# Patient Record
Sex: Male | Born: 2000 | Race: Black or African American | Hispanic: No | Marital: Single | Smoking: Never smoker
Health system: Southern US, Community
[De-identification: ages and names within clinical notes are randomized; demographics above are authoritative.]

## PROBLEM LIST (undated history)

## (undated) DIAGNOSIS — J45909 Unspecified asthma, uncomplicated: Secondary | ICD-10-CM

## (undated) DIAGNOSIS — J40 Bronchitis, not specified as acute or chronic: Secondary | ICD-10-CM

## (undated) HISTORY — DX: Unspecified asthma, uncomplicated: J45.909

---
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
## 2023-11-26 ENCOUNTER — Other Ambulatory Visit: Payer: Self-pay

## 2023-11-26 ENCOUNTER — Encounter (HOSPITAL_COMMUNITY): Payer: Self-pay | Admitting: Emergency Medicine

## 2023-11-26 ENCOUNTER — Emergency Department (HOSPITAL_COMMUNITY)
Admission: EM | Admit: 2023-11-26 | Discharge: 2023-11-26 | Disposition: A | Discharge status: Home / Self Care | Payer: Self-pay | Attending: Emergency Medicine | Admitting: Emergency Medicine

## 2023-11-26 DIAGNOSIS — S39012A Strain of muscle, fascia and tendon of lower back, initial encounter: Secondary | ICD-10-CM | POA: Insufficient documentation

## 2023-11-26 DIAGNOSIS — X509XXA Other and unspecified overexertion or strenuous movements or postures, initial encounter: Secondary | ICD-10-CM | POA: Insufficient documentation

## 2023-11-26 MED ORDER — KETOROLAC TROMETHAMINE 15 MG/ML IJ SOLN
15.0000 mg | Freq: Once | INTRAMUSCULAR | Status: AC
  Administered 2023-11-26: 15 mg via INTRAMUSCULAR
  Filled 2023-11-26: qty 1

## 2023-11-26 MED ORDER — CYCLOBENZAPRINE HCL 10 MG PO TABS: 10.0000 mg | ORAL_TABLET | Freq: Two times a day (BID) | ORAL | 0 refills | Status: AC | PRN

## 2023-11-26 MED ORDER — DIAZEPAM 2 MG PO TABS
2.0000 mg | ORAL_TABLET | ORAL | Status: AC
  Administered 2023-11-26: 2 mg via ORAL
  Filled 2023-11-26: qty 1

## 2023-11-26 MED ORDER — HYDROCODONE-ACETAMINOPHEN 5-325 MG PO TABS
1.0000 | ORAL_TABLET | ORAL | Status: AC
  Administered 2023-11-26: 1 via ORAL
  Filled 2023-11-26: qty 1

## 2023-11-26 NOTE — ED Provider Notes (Signed)
 [place] [facility] AT [facility] Provider Note   CSN: [id number] Arrival date & time: 11/26/23  4098     Patient presents with: Back Pain   Joe Nguyen is a 23 y.o. male.   23 year old male presents emergency department back pain.  Patient reports that he was sitting down and stretching his hamstrings when he felt sudden pain in his back.  Thinks he may have pulled a muscle.  He denies any bowel or bladder incontinence.  No leg weakness or numbness.  No history of back surgeries.  Not on blood thinners.  No fevers or history of IV drug use or indwelling catheters.  Has not yet tried any medications for this.  He went to urgent care noted that his blood pressure was in the 160s systolic and referred him to the emergency department for evaluation.       Prior to Admission medications   Medication Sig Start Date End Date Taking? Authorizing Provider  cyclobenzaprine (FLEXERIL) 10 MG tablet Take 1 tablet (10 mg total) by mouth 2 (two) times daily as needed for muscle spasms. 11/26/23  Yes Ninetta Basket, MD    Allergies: Patient has no known allergies.    Review of Systems  Updated Vital Signs BP 139/84 (BP Location: Right Arm)   Pulse 89   Temp 99.1 F (37.3 C) (Oral)   Resp 18   Ht 6' (1.829 m)   Wt 131.5 kg   SpO2 99%   BMI 39.33 kg/m   Physical Exam  Musculoskeletal:     Comments: No spinal midline TTP in cervical, thoracic, or lumbar spine.  Paraspinal tenderness to palpation at approximately L3 and L4 bilaterally.  No stepoffs noted.   Motor: Muscle bulk and tone are normal. Strength is 5/5 in hip flexion, knee flexion and extension, ankle dorsiflexion and plantar flexion bilaterally. Full strength of great toe dorsiflexion bilaterally.  Sensory: Intact sensation to light touch in L2 though S1 dermatomes bilaterally.   Reflexes: Patellar 2+ bilaterally, Achilles 2+ bilaterally, no ankle clonus bilaterally     (all labs ordered are  listed, but only abnormal results are displayed) Labs Reviewed - No data to display  EKG: None  Radiology: No results found.   Procedures   Medications Ordered in the ED  ketorolac (TORADOL) 15 MG/ML injection 15 mg (15 mg Intramuscular Given 11/26/23 1923)  HYDROcodone -acetaminophen  (NORCO/VICODIN) 5-325 MG per tablet 1 tablet (1 tablet Oral Given 11/26/23 1923)  diazepam (VALIUM) tablet 2 mg (2 mg Oral Given 11/26/23 1923)                                    Medical Decision Making Risk Prescription drug management.   Joe Nguyen is a 23 y.o. male who presents emergency department back pain after stretching  Initial Ddx:  Lumbar radiculopathy, spinal cord compression, pathologic fracture, spinal epidural abscess, spinal epidural hematoma  MDM:  Feel the patient likely has a muscle strain given their symptoms.  No signs or symptoms of cord compression such as bowel or bladder incontinence or numbness or weakness that would warrant MRI.  Low risk for pathologic fracture so do not feel that imaging is warranted at this time.  No risk factors for spinal epidural abscess or spinal epidural hematoma either.  Blood pressure also elevated which I believe is due to pain.  Plan:  Valium Norco Toradol  ED Summary/Re-evaluation:  Patient reassessed after pain was able to be controlled.  Blood pressure is now 139/84.  Feeling much better.  Will have him follow-up with spine as an outpatient if symptoms not improving given cyclobenzaprine in the meantime.  This patient presents to the ED for concern of complaints listed in HPI, this involves an extensive number of treatment options, and is a complaint that carries with it a high risk of complications and morbidity. Disposition including potential need for admission considered.   Dispo: DC Home. Return precautions discussed including, but not limited to, those listed in the AVS. Allowed pt time to ask questions which were answered  fully prior to dc.  Additional history obtained from mother Records reviewed Outpatient Clinic Notes I have reviewed the patients home medications and made adjustments as needed  Final diagnoses:  Strain of lumbar region, initial encounter    ED Discharge Orders          Ordered    cyclobenzaprine (FLEXERIL) 10 MG tablet  2 times daily PRN        11/26/23 1914               Ninetta Basket, MD 11/26/23 2351

## 2023-11-26 NOTE — ED Triage Notes (Signed)
 Pt ambulatory to triage. Pt reports that he may have pulled a muscle in his back. Pt reports that he was stretching and felt a sharp pain in his lower back.  Pt denies numbness and tingling in all extremities.

## 2023-11-26 NOTE — Discharge Instructions (Signed)
You were seen for your back pain in the emergency department.   At home, please use over-the-counter Tylenol, ibuprofen, and lidocaine patches.  You may also use the cyclobenzaprine we have prescribed you as needed for pain.  Do not take the cyclobenzaprine before driving or operating heavy machinery.  Follow-up with your primary doctor in 2-3 days regarding your visit.  Follow-up with the spine clinic as soon as possible regarding your symptoms.  Return immediately to the emergency department if you experience any of the following: Numbness or weakness of your legs, bowel or bladder incontinence, numbness while wiping after pooping or urinating, or any other concerning symptoms.    Thank you for visiting our Emergency Department. It was a pleasure taking care of you today.
